# Patient Record
Sex: Female | Born: 1935 | Race: White | Hispanic: No | Marital: Married
Health system: Southern US, Community
[De-identification: ages and names within clinical notes are randomized; demographics above are authoritative.]

---

## 2006-02-03 ENCOUNTER — Ambulatory Visit: Payer: Self-pay | Admitting: Internal Medicine

## 2006-08-11 ENCOUNTER — Ambulatory Visit: Payer: Self-pay | Admitting: Endocrinology

## 2006-12-11 ENCOUNTER — Other Ambulatory Visit: Payer: Self-pay

## 2006-12-11 ENCOUNTER — Inpatient Hospital Stay: Payer: Self-pay | Admitting: *Deleted

## 2007-07-18 ENCOUNTER — Ambulatory Visit: Payer: Self-pay | Admitting: Specialist

## 2007-12-08 ENCOUNTER — Ambulatory Visit: Payer: Self-pay | Admitting: Specialist

## 2008-12-20 ENCOUNTER — Emergency Department: Payer: Self-pay | Admitting: Unknown Physician Specialty

## 2009-02-02 ENCOUNTER — Emergency Department: Payer: Self-pay | Admitting: Emergency Medicine

## 2009-09-08 ENCOUNTER — Emergency Department: Payer: Self-pay | Admitting: Internal Medicine

## 2009-12-22 ENCOUNTER — Emergency Department: Payer: Self-pay | Admitting: Emergency Medicine

## 2010-04-24 ENCOUNTER — Encounter: Admission: RE | Admit: 2010-04-24 | Discharge: 2010-04-24 | Payer: Self-pay | Admitting: Neurology

## 2010-09-07 ENCOUNTER — Encounter: Payer: Self-pay | Admitting: Neurology

## 2011-04-08 ENCOUNTER — Emergency Department: Payer: Self-pay | Admitting: Emergency Medicine

## 2011-05-20 ENCOUNTER — Inpatient Hospital Stay: Payer: Self-pay | Admitting: Specialist

## 2011-08-21 ENCOUNTER — Emergency Department: Payer: Self-pay | Admitting: Emergency Medicine

## 2012-06-18 ENCOUNTER — Inpatient Hospital Stay: Payer: Self-pay | Admitting: Internal Medicine

## 2012-06-18 LAB — URINALYSIS, COMPLETE
Bilirubin,UR: NEGATIVE
Hyaline Cast: 13
Ketone: NEGATIVE
Ph: 5 (ref 4.5–8.0)
Protein: 30
Specific Gravity: 1.015 (ref 1.003–1.030)
Squamous Epithelial: <1
WBC UR: 67 /HPF (ref 0–5)

## 2012-06-18 LAB — COMPREHENSIVE METABOLIC PANEL
Albumin: 1.9 g/dL — ABNORMAL LOW (ref 3.4–5.0)
Alkaline Phosphatase: 806 U/L — ABNORMAL HIGH (ref 50–136)
BUN: 130 mg/dL — ABNORMAL HIGH (ref 7–18)
Bilirubin,Total: 2.6 mg/dL — ABNORMAL HIGH (ref 0.2–1.0)
Calcium, Total: 7.7 mg/dL — ABNORMAL LOW (ref 8.5–10.1)
EGFR (African American): 6 — ABNORMAL LOW
EGFR (Non-African Amer.): 5 — ABNORMAL LOW
Glucose: 165 mg/dL — ABNORMAL HIGH (ref 65–99)
Osmolality: 332 (ref 275–301)
SGOT(AST): 353 U/L — ABNORMAL HIGH (ref 15–37)
Sodium: 144 mmol/L (ref 136–145)

## 2012-06-18 LAB — CBC
MCH: 28.1 pg (ref 26.0–34.0)
MCV: 88 fL (ref 80–100)
Platelet: 178 10*3/uL (ref 150–440)
RBC: 4.11 10*6/uL (ref 3.80–5.20)
RDW: 21.1 % — ABNORMAL HIGH (ref 11.5–14.5)
WBC: 13.8 10*3/uL — ABNORMAL HIGH (ref 3.6–11.0)

## 2012-06-18 LAB — LIPASE, BLOOD: Lipase: 240 U/L (ref 73–393)

## 2012-06-19 ENCOUNTER — Ambulatory Visit: Payer: Self-pay | Admitting: Internal Medicine

## 2012-06-19 LAB — PROTIME-INR
INR: 1.2
Prothrombin Time: 15.3 secs — ABNORMAL HIGH (ref 11.5–14.7)

## 2012-06-19 LAB — URINE CULTURE

## 2012-06-19 LAB — COMPREHENSIVE METABOLIC PANEL
Alkaline Phosphatase: 878 U/L — ABNORMAL HIGH (ref 50–136)
Anion Gap: 10 (ref 7–16)
Calcium, Total: 8.1 mg/dL — ABNORMAL LOW (ref 8.5–10.1)
Chloride: 110 mmol/L — ABNORMAL HIGH (ref 98–107)
Co2: 24 mmol/L (ref 21–32)
Creatinine: 6 mg/dL — ABNORMAL HIGH (ref 0.60–1.30)
EGFR (African American): 7 — ABNORMAL LOW
EGFR (Non-African Amer.): 6 — ABNORMAL LOW
Osmolality: 329 (ref 275–301)
Sodium: 144 mmol/L (ref 136–145)

## 2012-06-20 LAB — COMPREHENSIVE METABOLIC PANEL
Alkaline Phosphatase: 762 U/L — ABNORMAL HIGH (ref 50–136)
Anion Gap: 12 (ref 7–16)
BUN: 119 mg/dL — ABNORMAL HIGH (ref 7–18)
Bilirubin,Total: 2.6 mg/dL — ABNORMAL HIGH (ref 0.2–1.0)
Chloride: 114 mmol/L — ABNORMAL HIGH (ref 98–107)
Co2: 20 mmol/L — ABNORMAL LOW (ref 21–32)
Creatinine: 5.7 mg/dL — ABNORMAL HIGH (ref 0.60–1.30)
EGFR (African American): 7 — ABNORMAL LOW
EGFR (Non-African Amer.): 6 — ABNORMAL LOW
Osmolality: 330 (ref 275–301)
Potassium: 5.8 mmol/L — ABNORMAL HIGH (ref 3.5–5.1)
SGPT (ALT): 92 U/L — ABNORMAL HIGH (ref 12–78)
Sodium: 146 mmol/L — ABNORMAL HIGH (ref 136–145)

## 2012-06-20 LAB — HEMOGLOBIN: HGB: 11.6 g/dL — ABNORMAL LOW (ref 12.0–16.0)

## 2012-06-20 LAB — CEA: CEA: 108.7 ng/mL — ABNORMAL HIGH (ref 0.0–4.7)

## 2012-06-20 LAB — CANCER ANTIGEN 19-9: CA 19-9: 688 U/mL — ABNORMAL HIGH (ref 0–35)

## 2012-06-20 LAB — POTASSIUM: Potassium: 5.9 mmol/L — ABNORMAL HIGH (ref 3.5–5.1)

## 2012-06-20 LAB — SODIUM: Sodium: 146 mmol/L — ABNORMAL HIGH (ref 136–145)

## 2012-06-21 LAB — COMPREHENSIVE METABOLIC PANEL
Albumin: 1.6 g/dL — ABNORMAL LOW (ref 3.4–5.0)
Alkaline Phosphatase: 691 U/L — ABNORMAL HIGH (ref 50–136)
Anion Gap: 13 (ref 7–16)
Bilirubin,Total: 2 mg/dL — ABNORMAL HIGH (ref 0.2–1.0)
Calcium, Total: 7.9 mg/dL — ABNORMAL LOW (ref 8.5–10.1)
Co2: 20 mmol/L — ABNORMAL LOW (ref 21–32)
Creatinine: 6.34 mg/dL — ABNORMAL HIGH (ref 0.60–1.30)
EGFR (African American): 6 — ABNORMAL LOW
EGFR (Non-African Amer.): 5 — ABNORMAL LOW
Glucose: 207 mg/dL — ABNORMAL HIGH (ref 65–99)
Potassium: 5.4 mmol/L — ABNORMAL HIGH (ref 3.5–5.1)
SGOT(AST): 577 U/L — ABNORMAL HIGH (ref 15–37)

## 2012-06-21 LAB — CBC WITH DIFFERENTIAL/PLATELET
Bands: 8 %
HCT: 36.6 % (ref 35.0–47.0)
HGB: 11.5 g/dL — ABNORMAL LOW (ref 12.0–16.0)
Lymphocytes: 5 %
MCHC: 31.3 g/dL — ABNORMAL LOW (ref 32.0–36.0)
Monocytes: 4 %
Myelocyte: 1 %
NRBC/100 WBC: 8 /
Platelet: 154 10*3/uL (ref 150–440)
RBC: 4.07 10*6/uL (ref 3.80–5.20)
RDW: 21.9 % — ABNORMAL HIGH (ref 11.5–14.5)
Segmented Neutrophils: 82 %
WBC: 21.9 10*3/uL — ABNORMAL HIGH (ref 3.6–11.0)

## 2012-06-24 LAB — CULTURE, BLOOD (SINGLE)

## 2012-07-17 ENCOUNTER — Ambulatory Visit: Payer: Self-pay | Admitting: Internal Medicine

## 2012-07-17 DEATH — deceased

## 2013-04-04 IMAGING — US ABDOMEN ULTRASOUND LIMITED
1 series · 1 of 1 positions shown · non-contrast
Comparison: none

REASON FOR EXAM: RUQ/epigastric pain
COMMENTS:

PROCEDURE:     US  - US ABDOMEN LIMITED SURVEY  - April 08, 2011  [DATE]
RESULT:     Comparison: None.
TECHNIQUE: Multiple grayscale and color Doppler images were obtained of the
right upper quadrant.

[Series 1: abdomen ultrasound limited · 1 of 1 slices shown]
[im 1/1]
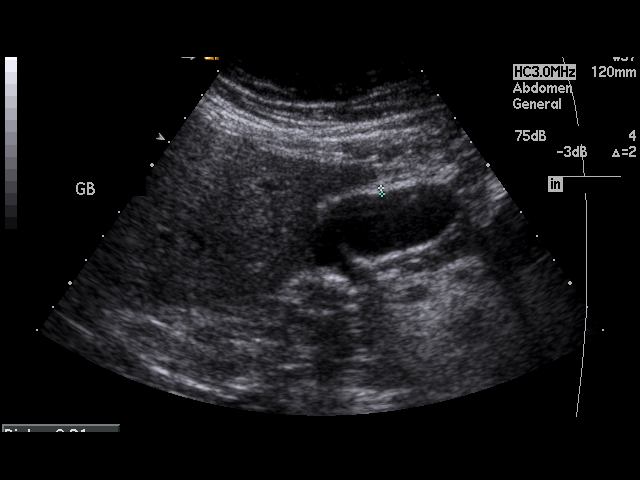

[1 of 1 positions shown; findings below may reference images not displayed]

FINDINGS: Multiple mobile stones are demonstrated in the gallbladder. The sonographic
Murphy sign was negative. No gallbladder wall thickening or pericholecystic
fluid. The common bile duct measures 5 mm. The pancreas is obscured by
overlying bowel gas. Visualized liver is unremarkable.
IMPRESSION: Cholelithiasis, without sonographic evidence of acute cholecystitis.

## 2013-08-17 IMAGING — CR DG CHEST 2V
1 series · 4 of 4 positions shown · non-contrast
Comparison: none

REASON FOR EXAM: dyspnea, fever
COMMENTS:

[Series 1: x chest ap · 0.14mm/px · 4 of 4 slices shown]
[im 1/4]
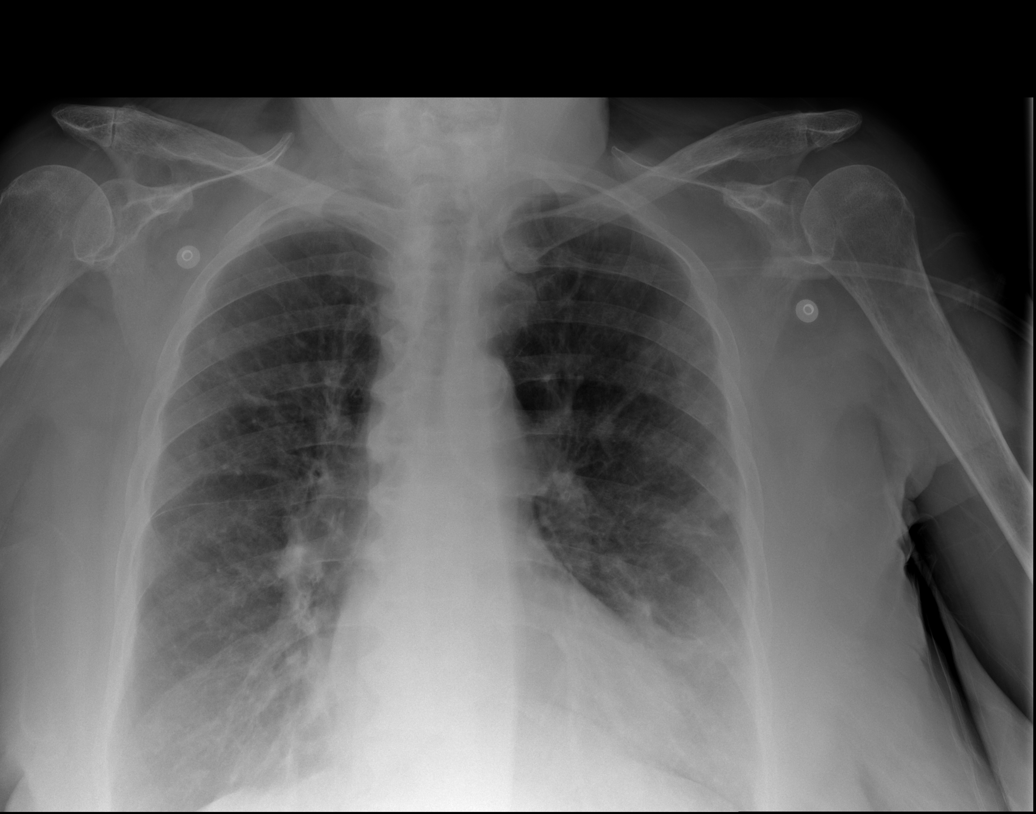
[im 2/4]
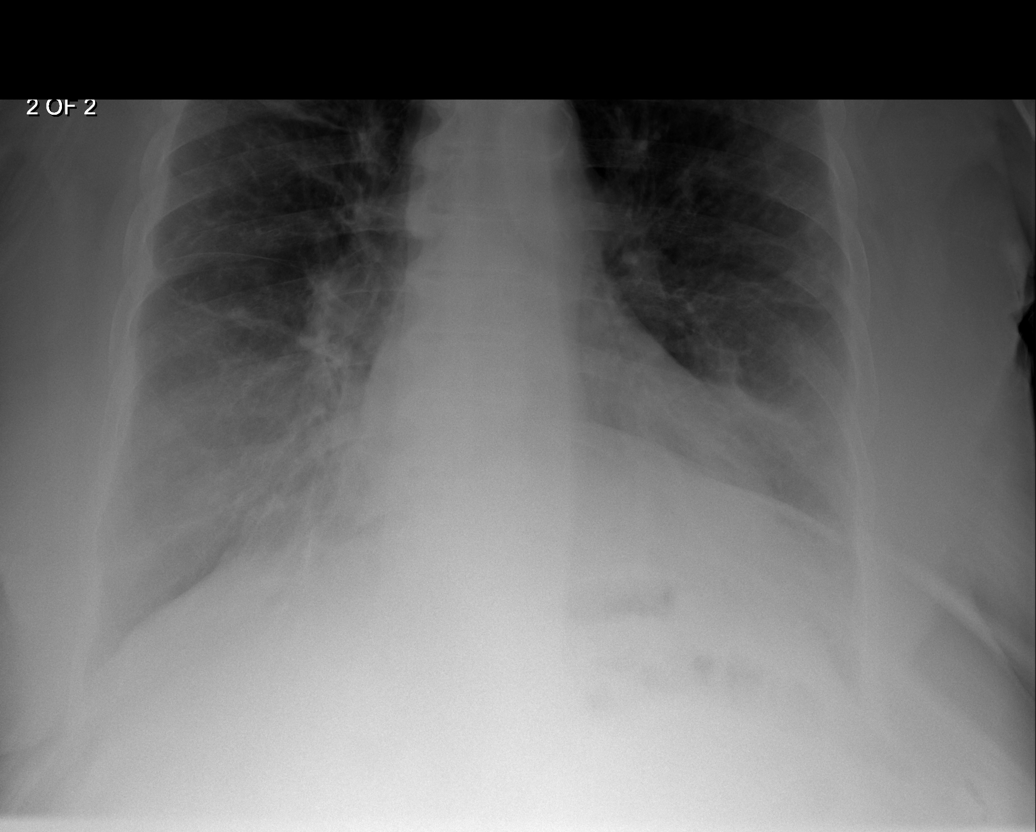
[im 3/4]
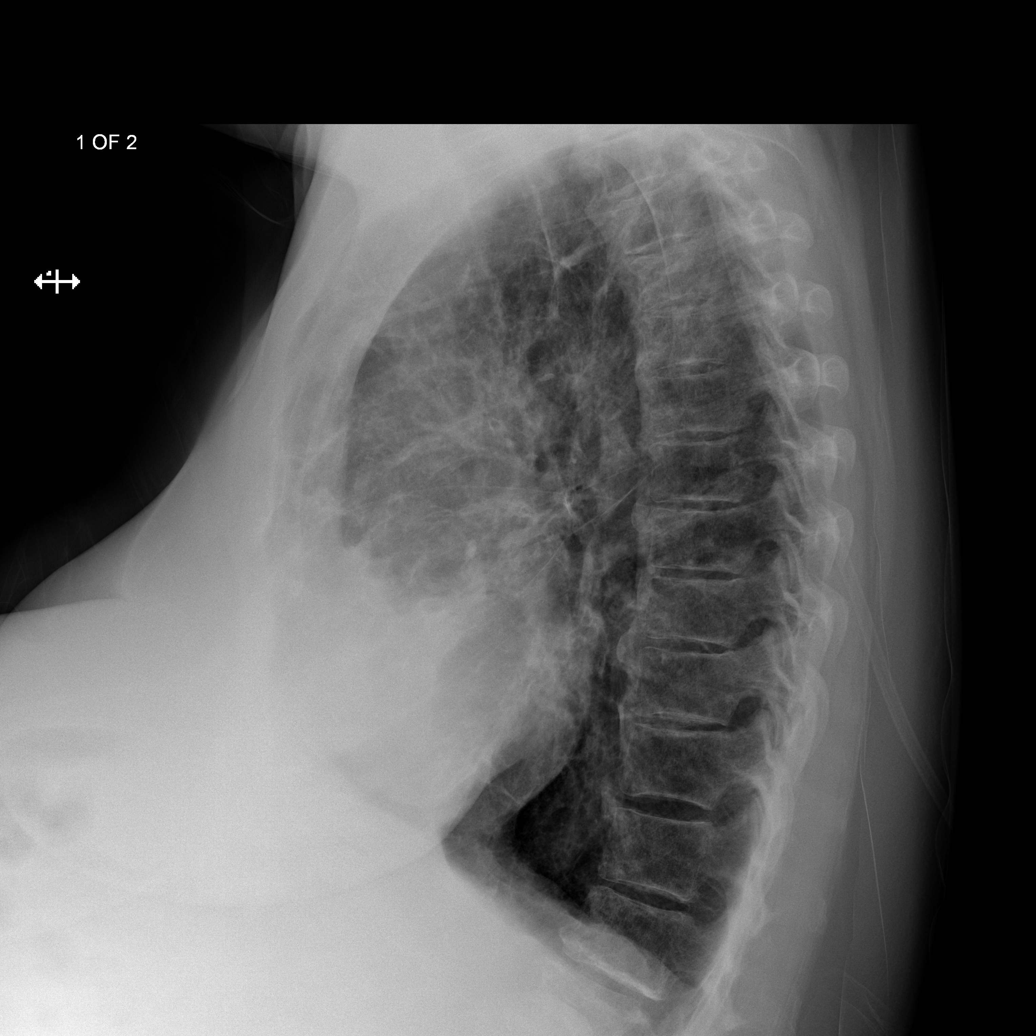
[im 4/4]
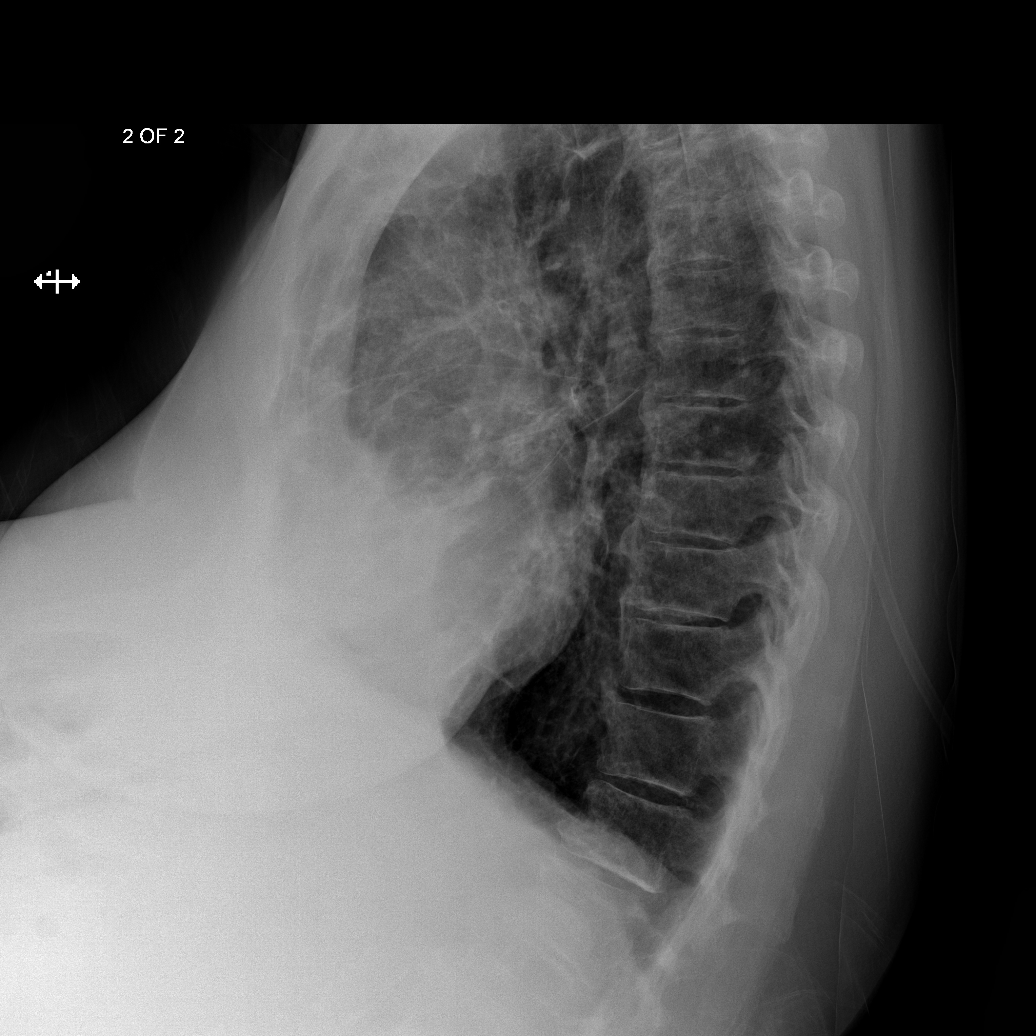

[4 of 4 positions shown; findings below may reference images not displayed]

PROCEDURE:     DXR - DXR CHEST PA (OR AP) AND LATERAL  - August 21, 2011  [DATE]

RESULT:     Comparison is made to a prior exam of 05/20/2011. There is patchy
thickening of the lung markings peripherally in the left lung. This appears
less prominent than was evident on the exam of 05/20/2011. There is haziness
at both lung bases compatible with overlying soft tissue. There is a linear
density in the right midlung field and in the right upper lobe compatible
with discoid atelectasis. Heart size remains normal. No pleural effusion is
seen.
IMPRESSION: 1. No pneumonia is identified.
2. There is improvement in the previously noted patchy densities
peripherally in the left lung as noted on the exam of 05/20/2011.
[DATE]. No new pulmonary infiltrates are seen.
4. Heart size is normal.

## 2014-06-15 IMAGING — US ABDOMEN ULTRASOUND
1 series · 14 of 25 positions shown · non-contrast
Comparison: none

REASON FOR EXAM: abnormal lfts
COMMENTS:

PROCEDURE:     US  - US ABDOMEN GENERAL SURVEY  - June 18, 2012  [DATE]
RESULT:     History: Abnormal LFTs.
Comparison Study: Abdominal ultrasound January 06, 2011.

[Series 1: abdomen ultrasound · 0.33mm/px · 14 of 111 slices shown]
[im 1/111]
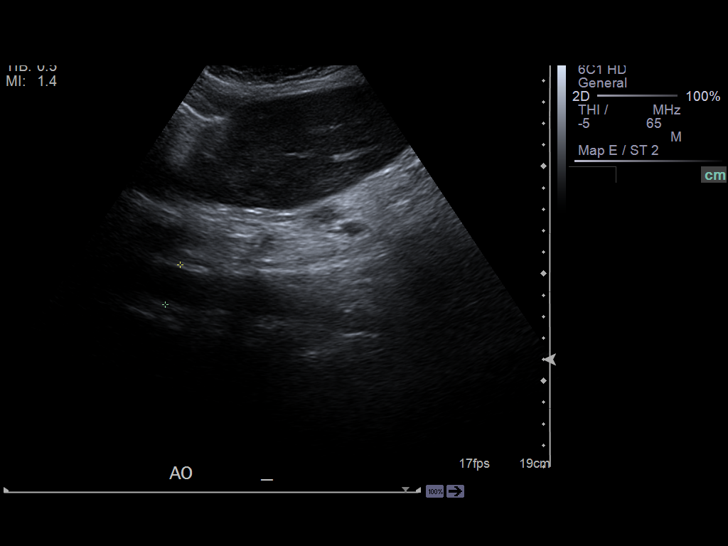
[im 10/111]
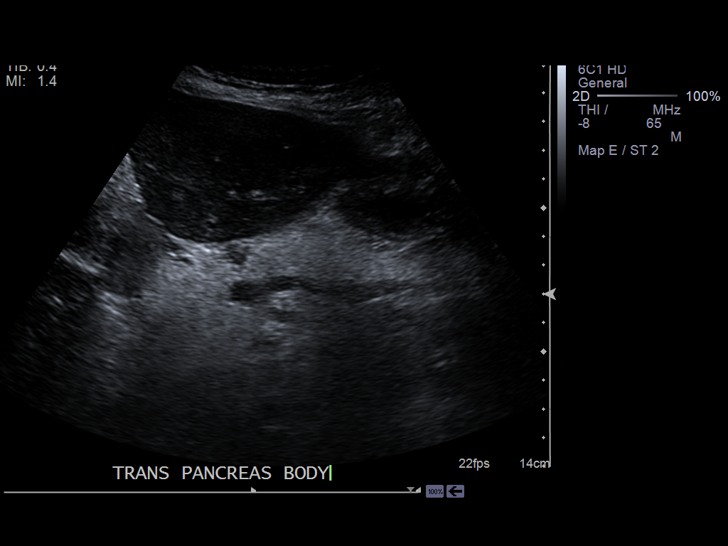
[im 19/111]
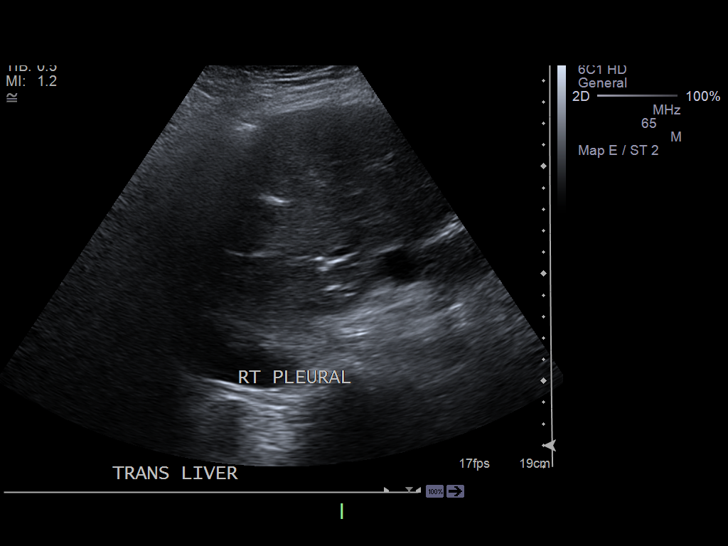
[im 28/111]
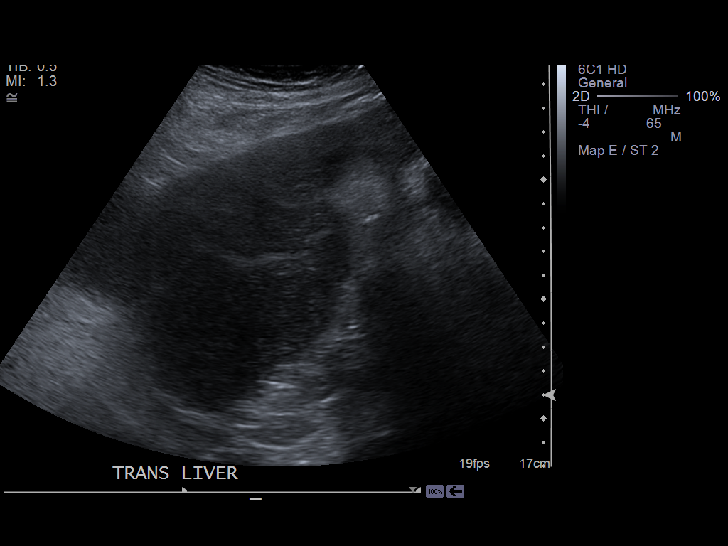
[im 37/111]
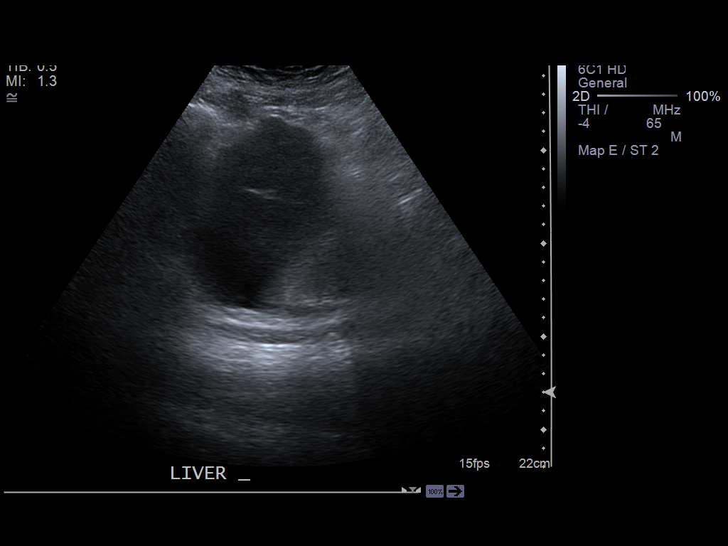
[im 42/111]
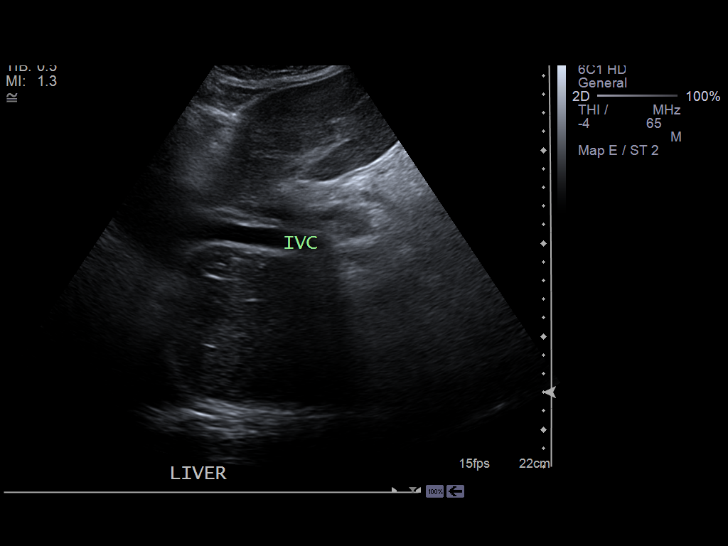
[im 51/111]
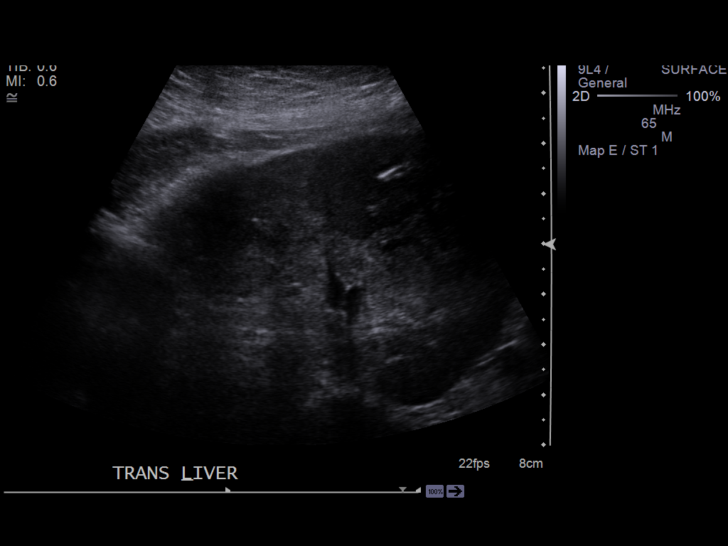
[im 60/111]
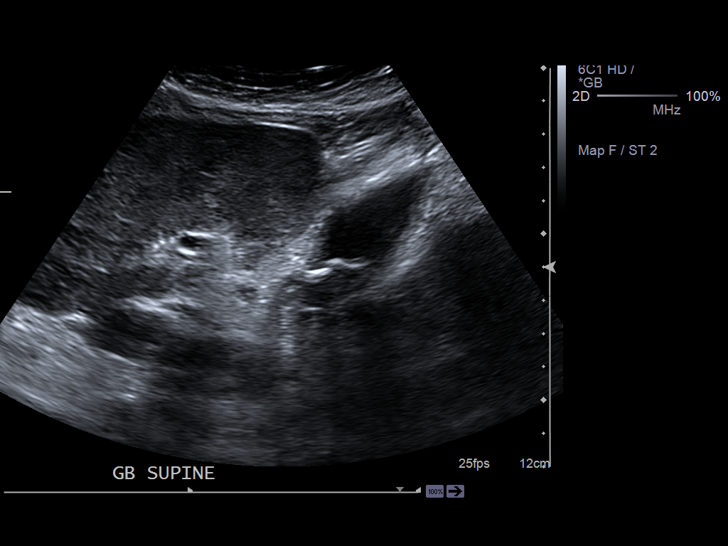
[im 69/111]
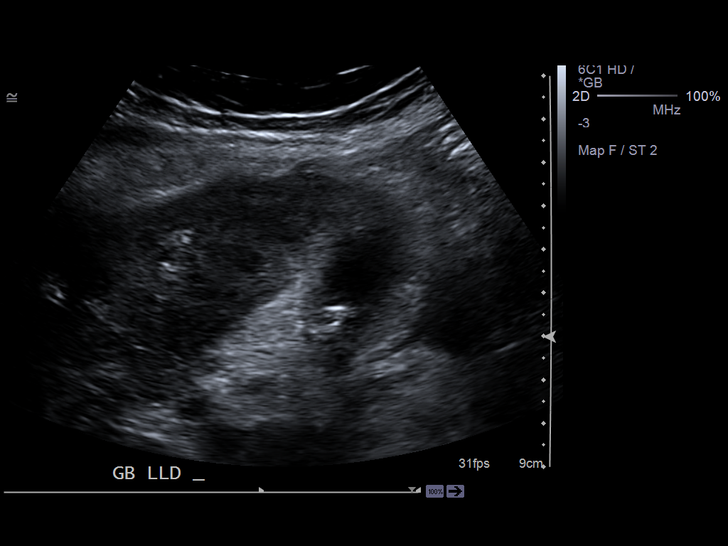
[im 74/111]
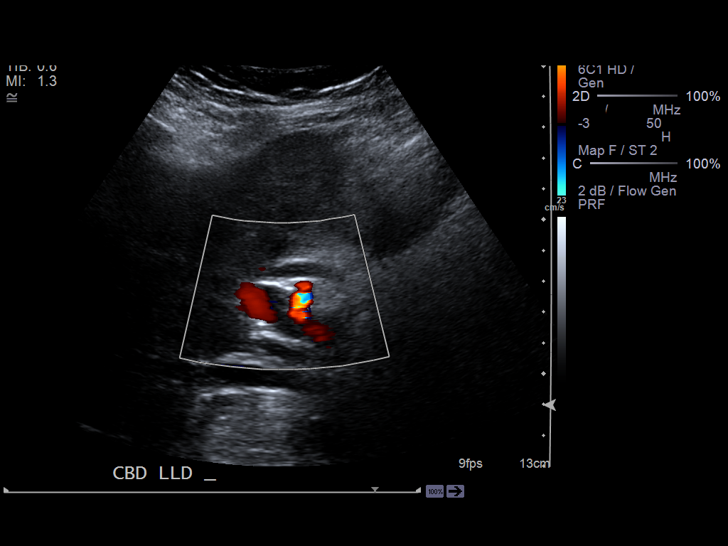
[im 83/111]
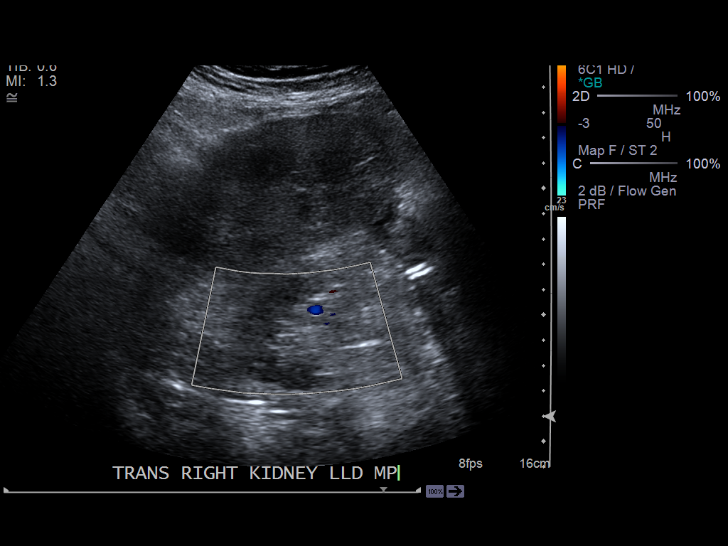
[im 92/111]
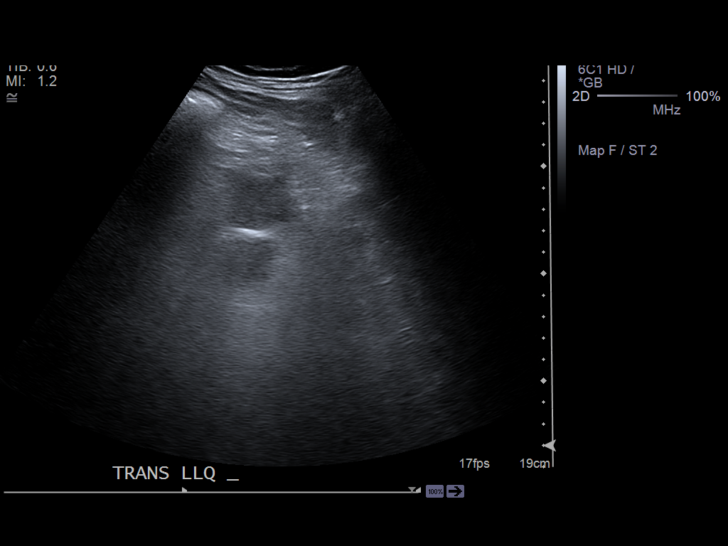
[im 101/111]
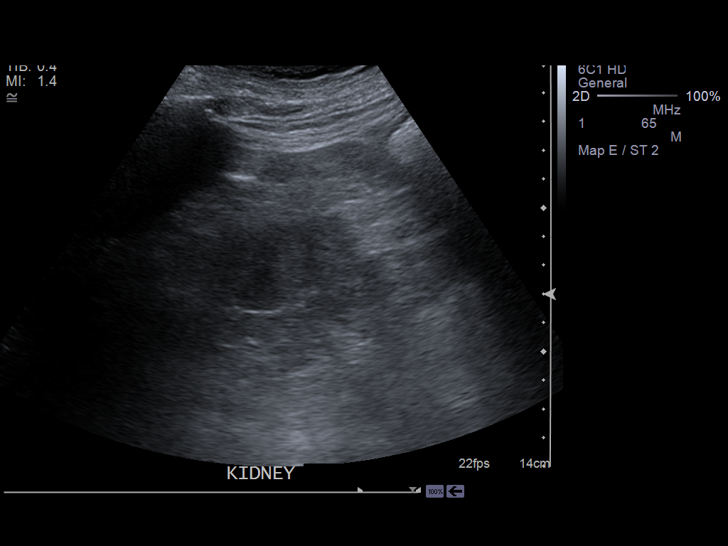
[im 111/111]
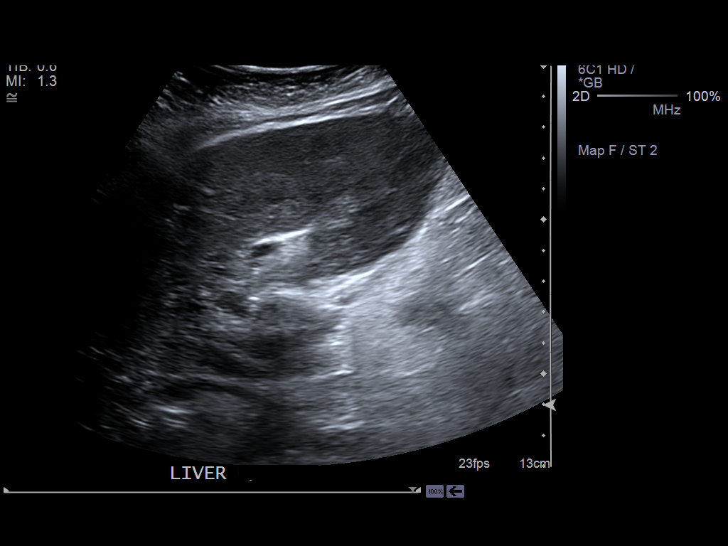

[14 of 25 positions shown; findings below may reference images not displayed]

FINDINGS: Standard ultrasound obtained. There is a solid hypoechoic 3 cm
mass within the pancreatic head. This is consistent with pancreatic
malignancy. Multiple hepatic lesions are noted the largest measuring 1.6 cm
consistent metastatic disease. Gallstones and sludge noted with mild
gallbladder with mild gallbladder wall thickening to 3.1 mm. Negative
Murphy's sign. No biliary distention, bile duct measures approximately
cm. No hydronephrosis.
IMPRESSION: Pancreatic mass with multiple hepatic masses. These
findings are consistent with pancreatic carcinoma with metastatic disease to
the liver.

## 2014-12-04 NOTE — Discharge Summary (Signed)
PATIENT NAME:  Diana, Alexander MR#:  [id number] DATE OF BIRTH:  03/30/1923  DATE OF ADMISSION:  06/18/2012 DATE OF DISCHARGE:  06/28/2012  ADMITTING DIAGNOSIS: Acute renal failure and hyperkalemia.  DISCHARGE DIAGNOSES: 1. Suspected pancreatic cancer with metastasis to liver. 2. Acute renal failure. 3. Hyperkalemia due to acute renal failure.  4. Pyuria, now urinary tract infection.  5. Elevated transaminases likely due to liver masses.  6. Hypoalbuminemia likely due to liver masses.  7. Back pain due to pancreatic cancer.  8. Altered mental status, likely hepatic encephalopathy verus uremic encephalopathy.  DISCHARGE CONDITION: Poor  DISCHARGE MEDICATIONS: The patient is being discharged to hospice home with the following medications.  1. Roxanol 0.25 to 0.5 mL p.o. sublingually every 1 to 2 hours as needed. 2. Lorazepam 0.5 to 1 mg p.o. sublingually every 2 to 4 hours as needed.  3. Ranitidine 150 mg p.o. twice daily. 4. ABHR suppository (Ativan) 0.5 mg/Benadryl 12.5 mg, Haldol 0.5 mg/Reglan 10 mg per rectum one suppository per rectum every 4 to 6 hours as needed for nausea or vomiting.  CONSULTANTS:  1. Care Management. 2. Lynnae Prude, MD. 3. Ned Grace, MD. 4. Shyrl Numbers, MD.  RADIOLOGIC STUDIES: Ultrasound of abdomen, general survey, 06/18/2012, revealed pancreatic mass with multiple hepatic masses. These findings are consistent with pancreatic carcinoma with metastatic disease to the liver.  HISTORY OF PRESENT ILLNESS: The patient is an 81 year old Caucasian female with past medical history significant for history of depression, diabetes mellitus, hypothyroidism, obesity, hypertension, chronic obstructive pulmonary disease, chronic respiratory failure on oxygen at home, history of congestive heart failure and other medical problems who presented to the hospital with complaints of nausea. Please refer to Dr. Melina Schools admission note on 06/18/2012.   On arrival to the  Emergency Room, the patient's vital signs showed a blood pressure of 133/48, pulse 76, temperature 97.4, and oxygen saturation was 99% on oxygen therapy. Physical exam revealed distended abdomen and normal bowel sounds and no masses were appreciated. Some pain however was elicited on upper abdominal palpation.   Lab data in the emergency room revealed BUN and creatinine of 130 and 6.32, glucose 165, and potassium 5.7, otherwise BMP was unremarkable. The patient's GFR, according to BMP, was calculated as 5 for non-African American.  Calcium level was low at 7.7. The patient's lipase level was normal at 240. The patient's liver enzymes were markedly abnormal with albumin level of 1.9, total protein 5.8, total bilirubin 2.6, alkaline phosphatase 806, AST 352, and ALT 115. Troponin was normal at 0.02. White blood cell count was elevated to 13.8, platelet count 178, and hemoglobin level 11.6. The patient's coagulation panel showed a pro time of 15.3 and INR was 1.2. Glucose x2 did not show any growth.   Urinalysis revealed amber/cloudy urine, negative for glucose, bilirubin or ketones, specific gravity 1.015, pH 5.0, 2+ blood, 30 mg/dL protein, negative for nitrites, 3+ leukocytes esterase, 116 red blood cells, 67 white blood cells, 1+ bacteria, less than 1 epithelial cell, and mucus was present as well as hyaline casts.   EKG revealed sinus rhythm at 79 beats per minute, left axis deviation, left bundle branch block, premature atrial complexes were present, and nonspecific ST-T changes.   Chest x-ray was not performed.  HOSPITAL COURSE:  The patient was admitted to the hospital. Because of her liver abnormalities as well as distended abdomen and discomfort in her abdomen on palpation, she had ultrasound of her abdomen done in the Emergency Room which revealed probable pancreatic  cancer with metastasis to liver. The patient was consulted by Dr. Wendie Simmeramiah, oncologist, who felt that the patient very likely has  probable pancreatic cancer and renal failure. She recommended liver biopsy when the patient is stable. However, she felt that overall her prognosis is very poor. The patient was having significant abdominal pain and significant back pain requiring opiate administration. Palliative care consultation was obtained and with this the patient's family decided not to pursue any invasive therapies or evaluation. The patient's kidney function did not improve despite IV fluid administration as well as treatment for possible urinary tract infection. Her creatinine remained high and her potassium level also was very high. She was noted to have elevated ammonia level as well, as mentioned above, very high BUN level. She remained very poorly responsive while in the hospital and her albumin level declined with IV fluid administration. Her liver enzymes also did not improve. Her white blood cell count kept going up despite antibiotic therapy for possible urinary tract infection. The patient had carbohydrate antigen 19-9 checked which was very high at 688 and CEA was high at 108.7. This was most suggestive of pancreatic cancer. The family wished to have palliative care only so no further investigations were done and when a bed became available in the hospice facility the patient was discharged.  On the day of discharge, she was unresponsive. She was given opiates for comfort measures. Her vital signs however were stable with temperature 98.2, pulse 87, respiration rate 20, blood pressure 120/63, and saturation was 95% on 2 liters of oxygen through nasal cannula.   TIME SPENT: 40 minutes. ____________________________ Katharina Caperima Karema Tocci, MD rv:slb D: 02/22/12 18:28:32 ET T: 06/22/2012 10:43:31 ET JOB#: [id number]  cc: Katharina Caperima Montey Ebel, MD, <Dictator> Serita ShellerErnest B. Maryellen PileEason, MD Katharina CaperIMA Jes Costales MD ELECTRONICALLY SIGNED 07/01/2012 12:59

## 2014-12-04 NOTE — Consult Note (Signed)
Brief Consult Note: Diagnosis: Probable metastatic pancreatic cancer.   Patient was seen by consultant.   Discussed with Attending MD.   Comments: Probable pancreatic cancer with renal failure.Would recommend liver biopsy when stable.Overall  Prognosis poor.  Electronic Signatures: Antony Hasteamiah, Jayce Boyko S (MD)  (Signed [phone]-Nov-13 12:09)  Authored: Brief Consult Note   Last Updated: 03-Nov-13 12:09 by Antony Hasteamiah, Uchenna Rappaport S (MD)

## 2014-12-04 NOTE — Consult Note (Signed)
History of Present Illness:   Reason for Consult Probable pancreatic cancer    HPI   History obtained from Dr Darrick Huntsmanullo as patient not able to provide any details.[name] is an 81 year old white female, a resident of [facility], with a history of dementia, dysphagia, diabetes mellitus, and chronic obstructive pulmonary disease with chronic respiratory failure on chronic O2 who presents with a several month history of decreased appetite and recurrent nausea recently trated for Uti with  ciprofloxacin by her primary care doctor, Dr. Maryellen PileEason. labs on 06/19/12 showed hyperkalemia with a potassium of 6.1 and a creatinine of 6.3. She was transferred to the Emergency Room for admission. In the Emergency Room, she was found to have a 3 cm mass at the head of the pancreas which was found by ultrasound, obtained by Dr. Shaune PollackLord, due to concern for obstructive jaundice due to significantly elevated liver function tests. A 3 cm solid mass at the head of the pancreas with liver mets was seen on the ultrasound with no evidence of choledocholithiasis or cholecystitis.    PFSH:   Family History positive, uterine cancer    Social History positive tobacco, quit several years ago    Additional Past Medical and Surgical History COPD Hypothyroidism   Review of Systems:   Review of Systems   Not able to obtain due to patients advanced dementia  NURSING NOTES: **Vital Signs.:   03-Nov-13 13:17    Vital Signs Type: Routine    Temperature Temperature (F): 97.2    Celsius: 36.2    Pulse Pulse: 78    Respirations Respirations: 18    Systolic BP Systolic BP: 109    Diastolic BP (mmHg) Diastolic BP (mmHg): 55    Mean BP: 73    Pulse Ox % Pulse Ox %: 93    Pulse Ox Activity Level: At rest    Oxygen Delivery: 2L   Physical Exam:   General frail elderly female appears uncomfortable    Lungs: decreased breath souns bilaterally    Abdomen: soft  distended    Extremities: edema    Neuro:  disoriented  non co-operative moving all limbs    Psych: mood agitated    Feldene: Rash  PCN: Unknown  Assessment and Plan:  Impression:   81 Year old female with multiple medical issues including dementia now with acute presentation of renal failure with labs and imaging consistent with possible pancreatic cancer with liver mets.Also abnormal liver function tests .  Plan:   Elderly female with multi-organ failure with probable pancreatic cancer with multiple liver metastases. Prognosis poor.At this time would recommend palliative care consult. Await CA 19-9 results.Would not pursue biopsy as unlikely to alter overall prognosis especially with renal failure and abnormal liver function tests.discussed this with primary team on 06/19/12.appreciated.  Electronic Signatures: Antony Hasteamiah, Colbie Danner S (MD)  (Signed [phone]-Nov-13 11:51)  Authored: HISTORY OF PRESENT ILLNESS, PFSH, ROS, NURSING NOTES, PE, ALLERGIES, ASSESSMENT AND PLAN   Last Updated: 04-Nov-13 11:51 by Antony Hasteamiah, Massiah Minjares S (MD)

## 2014-12-04 NOTE — H&P (Signed)
PATIENT NAME:  Diana Alexander, Diana Alexander MR#:  [id number] DATE OF BIRTH:  August 23, 1922  DATE OF ADMISSION:  06/18/2012  CHIEF COMPLAINT: Nausea.   HISTORY OF PRESENT ILLNESS: [name] is an 81 year old white female, a resident of [facility], with a history of dementia, dysphagia, diabetes mellitus, and chronic obstructive pulmonary disease with chronic respiratory failure on chronic O2 who presents with a several month history of decreased appetite and recurrent nausea followed by signs and symptoms of a urinary tract infection which started yesterday. She was started on ciprofloxacin by her primary care doctor, Dr. Maryellen PileEason, and labs drawn and results as of this morning showed hyperkalemia with a potassium of 6.1 and a creatinine of 6.3. She was transferred to the Emergency Room for admission. In the Emergency Room, she was found to have a 3 cm mass at the head of the pancreas which was found by ultrasound, obtained by Dr. Shaune PollackLord, due to concern for obstructive jaundice due to significantly elevated liver function tests. A 3 cm solid mass at the head of the pancreas with liver mets was seen on the ultrasound with no evidence of choledocholithiasis or cholecystitis. She had a history of cholelithiasis by ultrasound done in August of 2012 without any signs of obstruction. The pancreas at that time was obscured by overlying bowel gas. She had a CT of the chest in October 2012 during admission for acute on chronic respiratory failure and bilateral pulmonary infiltrates were seen which partially improved by subsequent January 2013 chest x-ray.   The patient's primary care physician is Dr. Toy CookeyErnest Eason since her move to [facility] one year ago.   PAST MEDICAL HISTORY:  1. Depression.  2. Diabetes mellitus, on insulin.  3. Hypothyroidism.  4. Obesity.  5. Hypertension.  6. Chronic obstructive pulmonary disease with chronic respiratory failure requiring oxygen.  7. History of heart disease with  congestive heart failure, systolic function unknown.  8. Venous insufficiency.    PAST SURGICAL HISTORY: Hysterectomy.  ALLERGIES: Penicillin and Feldene.   HOME MEDICATIONS: 1. Vitamin D3 50,000 units monthly. 2. Ventolin HFA p.r.n.  3. Seroquel 100 mg at bedtime. 4. Tramadol 50 mg every six hours as needed for pain. 5. Protonix 40 mg daily.  6. Oxygen by nasal cannula 2 liters per minute.  7. NovoLog sliding scale.  8. Lantus 8 units daily.  9. Synthroid 137 mcg daily in the morning. 10. Imdur 30 mg daily. 11. Lasix 40 mg daily.  12. Advair 250/50 one puff twice a day. 13. Lexapro 20 mg daily.  14. Aricept 10 mg daily.  15. B12 1000 mcg daily.  16. Wellbutrin 150 mg daily.  17. Lipitor 10 mg daily.  18. Alprazolam 0.5 mg p.r.n. anxiety. 19. Atrovent every six hours.   FAMILY HISTORY: Mother died at 6192 of uterine cancer. Father died an early death due to alcohol abuse. She had one brother with lung cancer.   SOCIAL HISTORY: She has been a resident of [facility] for one year. She smoked a pack a day for 40 years and quit about 20 years ago. She had a son with Down's syndrome, a daughter with coarctation of the aorta, and two healthy daughters, one who is her healthcare power of attorney named Gwenyth AllegraKay Morris, telephone number [phone].   REVIEW OF SYSTEMS: Unavailable but per daughter who is present she has had weakness and weight loss. She has had decreased appetite and recurrent nausea for several months. She has chronic dyspnea secondary to chronic  obstructive pulmonary disease. She has general malaise and is a "difficult" patient due to depression and malaise. She has chronic insomnia treated with Seroquel in the evening.   PHYSICAL EXAMINATION:   GENERAL: This is an elderly female who is currently somnolent and opens eyes to voice but does not follow commands.   VITAL SIGNS: Blood pressure 133/48, repeat 116/47, pulse 76, rhythm regular, temperature 97.4, and  respirations 99%.   HEENT: Pupils are notable for arcus senilis and some cataracts. Sclerae are anicteric. Oropharynx shows some teeth lacking.   NECK: Supple without lymphadenopathy, JVD, thyromegaly, or carotid bruits.   PULMONARY: Lungs are clear anteriorly but breath sounds are diminished.   CARDIOVASCULAR: Regular rate and rhythm. No murmurs, rubs, or gallops. Chest wall is nontender. She does have 1+ pitting edema bilaterally. Pulses are feeble.   ABDOMEN: Distended with normal bowel sounds. No masses are appreciated. She is moving all extremities but cannot cooperate with exam.   SKIN: Warm and dry without rashes or lesions.   LYMPH: There is no cervical, axillary, inguinal, or supraclavicular lymphadenopathy.   NEUROLOGICAL: Exam appears grossly nonfocal; however, the patient is somnolent.   ADMISSION LABORATORY, DIAGNOSTIC AND RADIOLOGIC DATA: Sodium 144, potassium 5.7, chloride 109, bicarbonate 24, BUN 130, creatinine 6.32, and glucose 165. White count 13.8, hemoglobin 11.6, and platelets 178. LFTs are notable for an AST of 353, ALT 115, alkaline phosphatase 806, total bilirubin 2.6, and albumin 1.9. Lipase 240. Troponin I 0.02.   Urinalysis is notable for 30 of protein, 3+ leukocyte esterase, and 67 white cells.  Ultrasound of the abdomen shows a 3 cm solid mass within the pancreatic head with multiple hepatic lesions the largest measuring 1.6 cm. Gallstones and sludge are noted with mild gallbladder wall thickening. Negative Murphy sign. No biliary distention. Biliary duct is 5.3 cm and there is no hydronephrosis.   EKG: Sinus rhythm with left bundle branch block, which is chronic. No changes compared to prior EKG.   ASSESSMENT AND PLAN:  1. Acute renal failure with hyperkalemia likely secondary to dehydration complicated by urinary tract infection. There is no hydronephrosis noted, so we will continue IV fluids and follow creatinine. There are no plans for dialysis given her  age, dementia, and new diagnosis of metastatic pancreatic cancer.  2. Pancreatic cancer. I have discussed her prognosis with her daughter. We are awaiting the arrival of Joyce Gross, her healthcare power of attorney, but current family available is in agreement with palliative care and hospice. They would like the patient to return to [facility] for hospice treatment if she survives the next several days. Palliative care consultation has been requested. I have also asked gastroenterology to see her to consider whether there would be an indication for biliary stent for palliative measures.  3. Urinary tract infection. Continue Cipro and adding Flagyl, which was started by the ER doctor. This will be stopped now given the findings on the ultrasound.   CODE STATUS: DO NOT RESUSCITATE.   ESTIMATED TIME OF CARE: 60 minutes. ____________________________ Duncan Dull, MD tt:slb Alexander: 06/18/2012 18:12:17 ET T: 06/19/2012 09:53:46 ET JOB#: [id number]  cc: Duncan Dull, MD, <Dictator> Serita Sheller. Maryellen Pile, MD Duncan Dull MD ELECTRONICALLY SIGNED 06/30/2012 16:01

## 2014-12-04 NOTE — Consult Note (Signed)
CC: pancreatic cancer.  Family wishes to have palliative care only so I  will sign off.  reconsult if needed.  Electronic Signatures: Scot JunElliott, Minervia Osso T (MD)  (Signed on 04-Nov-13 17:59)  Authored  Last Updated: 04-Nov-13 17:59 by Scot JunElliott, Tajuana Kniskern T (MD)

## 2014-12-04 NOTE — Consult Note (Signed)
CC: pancreatic mass with spots in liver.  Due to her severe renal failure and the fact she is not a dialysis candidate will hold off on any ERCP or plans for stent at this time.  Discussed briefly with Dr. Loreli SlotViakute and family.  Will discuss with Dr. Bluford Kaufmannh tomorrow as well and will continue to follow with you.  Review of previous records and this admission H and P but formal consult note not done.  Electronic Signatures: Scot JunElliott, Daishia Fetterly T (MD)  (Signed on 03-Nov-13 12:17)  Authored  Last Updated: 03-Nov-13 12:17 by Scot JunElliott, Mikka Kissner T (MD)
